# Patient Record
Sex: Female | Born: 1990 | Race: Black or African American | Hispanic: No | Marital: Single | Smoking: Never smoker
Health system: Southern US, Community
[De-identification: ages and names within clinical notes are randomized; demographics above are authoritative.]

## PROBLEM LIST (undated history)

## (undated) HISTORY — PX: KNEE SURGERY: SHX244

---

## 2013-05-25 ENCOUNTER — Encounter (HOSPITAL_COMMUNITY): Payer: Self-pay | Admitting: Emergency Medicine

## 2013-05-25 ENCOUNTER — Emergency Department (HOSPITAL_COMMUNITY)
Admission: EM | Admit: 2013-05-25 | Discharge: 2013-05-25 | Discharge status: Against Medical Advice | Attending: Emergency Medicine | Admitting: Emergency Medicine

## 2013-05-25 DIAGNOSIS — F1210 Cannabis abuse, uncomplicated: Secondary | ICD-10-CM | POA: Insufficient documentation

## 2013-05-25 DIAGNOSIS — F410 Panic disorder [episodic paroxysmal anxiety] without agoraphobia: Secondary | ICD-10-CM | POA: Insufficient documentation

## 2013-05-25 NOTE — ED Notes (Addendum)
Pt presents tearful, stating she is trying to keep up with college and felt if she went to sleep tonight she wouldn't be able to breath. Pt states she smoked marijuana for the first time tonight.  Denies pain.

## 2014-01-14 ENCOUNTER — Emergency Department (HOSPITAL_COMMUNITY)
Admission: EM | Admit: 2014-01-14 | Discharge: 2014-01-14 | Disposition: A | Discharge status: Home / Self Care | Attending: Emergency Medicine | Admitting: Emergency Medicine

## 2014-01-14 ENCOUNTER — Encounter (HOSPITAL_COMMUNITY): Payer: Self-pay | Admitting: Emergency Medicine

## 2014-01-14 ENCOUNTER — Emergency Department (HOSPITAL_COMMUNITY)

## 2014-01-14 DIAGNOSIS — S199XXA Unspecified injury of neck, initial encounter: Secondary | ICD-10-CM | POA: Insufficient documentation

## 2014-01-14 DIAGNOSIS — S3992XA Unspecified injury of lower back, initial encounter: Secondary | ICD-10-CM | POA: Diagnosis not present

## 2014-01-14 DIAGNOSIS — Z79899 Other long term (current) drug therapy: Secondary | ICD-10-CM | POA: Diagnosis not present

## 2014-01-14 DIAGNOSIS — S6992XA Unspecified injury of left wrist, hand and finger(s), initial encounter: Secondary | ICD-10-CM | POA: Diagnosis present

## 2014-01-14 DIAGNOSIS — Y9389 Activity, other specified: Secondary | ICD-10-CM | POA: Insufficient documentation

## 2014-01-14 DIAGNOSIS — Y92410 Unspecified street and highway as the place of occurrence of the external cause: Secondary | ICD-10-CM | POA: Diagnosis not present

## 2014-01-14 DIAGNOSIS — Z791 Long term (current) use of non-steroidal anti-inflammatories (NSAID): Secondary | ICD-10-CM | POA: Diagnosis not present

## 2014-01-14 MED ORDER — CYCLOBENZAPRINE HCL 10 MG PO TABS: 10.0000 mg | ORAL_TABLET | Freq: Two times a day (BID) | ORAL | Status: AC | PRN

## 2014-01-14 MED ORDER — NAPROXEN 500 MG PO TABS: 500.0000 mg | ORAL_TABLET | Freq: Two times a day (BID) | ORAL | Status: AC

## 2014-01-14 NOTE — ED Notes (Signed)
Patient transported to X-ray 

## 2014-01-14 NOTE — Discharge Instructions (Signed)
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
## 2014-05-11 ENCOUNTER — Emergency Department (HOSPITAL_COMMUNITY)
Admission: EM | Admit: 2014-05-11 | Discharge: 2014-05-11 | Disposition: A | Discharge status: Home / Self Care | Attending: Emergency Medicine | Admitting: Emergency Medicine

## 2014-05-11 ENCOUNTER — Encounter (HOSPITAL_COMMUNITY): Payer: Self-pay | Admitting: *Deleted

## 2014-05-11 DIAGNOSIS — J029 Acute pharyngitis, unspecified: Secondary | ICD-10-CM | POA: Diagnosis not present

## 2014-05-11 DIAGNOSIS — Z791 Long term (current) use of non-steroidal anti-inflammatories (NSAID): Secondary | ICD-10-CM | POA: Insufficient documentation

## 2014-05-11 DIAGNOSIS — H9202 Otalgia, left ear: Secondary | ICD-10-CM | POA: Diagnosis not present

## 2014-05-11 DIAGNOSIS — R509 Fever, unspecified: Secondary | ICD-10-CM | POA: Diagnosis present

## 2014-05-11 LAB — RAPID STREP SCREEN (MED CTR MEBANE ONLY): Streptococcus, Group A Screen (Direct): NEGATIVE

## 2014-05-11 MED ORDER — LIDOCAINE VISCOUS 2 % MT SOLN: 20.0000 mL | OROMUCOSAL | Status: AC | PRN

## 2014-05-11 MED ORDER — ACETAMINOPHEN 325 MG PO TABS
650.0000 mg | ORAL_TABLET | Freq: Four times a day (QID) | ORAL | Status: DC | PRN
  Administered 2014-05-11: 650 mg via ORAL
  Filled 2014-05-11: qty 2

## 2014-05-11 NOTE — ED Notes (Signed)
Pt c/o sore throat, fever and left earache since yesterday. Pt has been taking motrin with minimal relief.

## 2014-05-11 NOTE — ED Provider Notes (Signed)
CSN: [id number]     Arrival date & time 05/11/14  1713 History  This chart was scribed for Santiago Glad, PA-C, working with No att. providers found by Elon Spanner, ED Scribe. This patient was seen in room TR10C/TR10C and the patient's care was started at 5:56 PM.   Chief Complaint  Patient presents with  . Fever  . Otalgia  . Sore Throat   The history is provided by the patient. No language interpreter was used.   HPI Comments: Carol Franco is a 24 y.o. female who presents to the Emergency Department complaining of a fever onset yesterday morning with an associated worsening sore throat, left ear pain, and sinus drainage.  Patient has taken motrin which relieved the fever transiently yesterday but has not afforded relief of her sore throat . Patient is tolerating food and fluids well.  Patient denies nausea, vomiting, or difficulty swallowing.     History reviewed. No pertinent past medical history. Past Surgical History  Procedure Laterality Date  . Knee surgery     History reviewed. No pertinent family history. History  Substance Use Topics  . Smoking status: Never Smoker   . Smokeless tobacco: Not on file  . Alcohol Use: Yes     Comment: occasional   OB History    No data available     Review of Systems  Constitutional: Positive for fever.  HENT: Positive for congestion, ear pain and sore throat.   All other systems reviewed and are negative.     Allergies  Review of patient's allergies indicates no known allergies.  Home Medications   Prior to Admission medications   Medication Sig Start Date End Date Taking? Authorizing Provider  cyclobenzaprine (FLEXERIL) 10 MG tablet Take 1 tablet (10 mg total) by mouth 2 (two) times daily as needed for muscle spasms. 01/14/14   Linwood Dibbles, MD  naproxen (NAPROSYN) 500 MG tablet Take 1 tablet (500 mg total) by mouth 2 (two) times daily. 01/14/14   Linwood Dibbles, MD   BP 116/69 mmHg  Pulse 111  Temp(Src) 100.8 F (38.2 C)  (Oral)  Resp 18  Ht  (1.676 m)  Wt 125 lb (56.7 kg)  BMI 20.19 kg/m2  SpO2 100% Physical Exam  Constitutional: She is oriented to person, place, and time. She appears well-developed and well-nourished. No distress.  HENT:  Head: Normocephalic and atraumatic.  Nose: Right sinus exhibits no maxillary sinus tenderness and no frontal sinus tenderness. Left sinus exhibits no maxillary sinus tenderness and no frontal sinus tenderness.  Bilateral tonsillar enlargement with exudates.  No uvula deviation.  No trismus.  Normal voice phonation.  Handling secretions well.  No drooling.  Anterior cervical lymphadenopathy.  Bilteral TM's and canals normal  Eyes: Conjunctivae and EOM are normal.  Neck: Normal range of motion. Neck supple. No tracheal deviation present.  Cardiovascular: Normal rate, regular rhythm and normal heart sounds.   Pulmonary/Chest: Effort normal and breath sounds normal. No respiratory distress.  Musculoskeletal: Normal range of motion.  Neurological: She is alert and oriented to person, place, and time.  Skin: Skin is warm and dry.  Psychiatric: She has a normal mood and affect. Her behavior is normal.  Nursing note and vitals reviewed.   ED Course  Procedures (including critical care time)  DIAGNOSTIC STUDIES: Oxygen Saturation is 100% on RA, normal by my interpretation.    COORDINATION OF CARE:  6:25 PM Discussed treatment plan with patient at bedside.  Patient acknowledges and agrees with plan.  Labs Review Labs Reviewed  RAPID STREP SCREEN    Imaging Review No results found.   EKG Interpretation None      MDM   Final diagnoses:  None   Patient presents today with sore throat and fever.  Rapid strep negative.  Throat culture pending.  No signs of Peritonsillar or Retropharyngeal Abscess at this time.  Feel that the patient is stable for discharge.  Return precautions given.   Santiago GladHeather Kloe Oates, PA-C 05/13/14 2226  Dione Boozeavid Glick, MD 05/13/14  [phone]

## 2014-05-15 LAB — CULTURE, GROUP A STREP
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
